# Patient Record
Sex: Male | Born: 1945 | Hispanic: No | Marital: Married | Smoking: Never smoker
Health system: Southern US, Community
[De-identification: ages and names within clinical notes are randomized; demographics above are authoritative.]

## PROBLEM LIST (undated history)

## (undated) DIAGNOSIS — C61 Malignant neoplasm of prostate: Secondary | ICD-10-CM

## (undated) DIAGNOSIS — K429 Umbilical hernia without obstruction or gangrene: Secondary | ICD-10-CM

## (undated) HISTORY — DX: Malignant neoplasm of prostate: C61

---

## 2012-03-02 HISTORY — PX: PROSTATE BIOPSY: SHX241

## 2012-09-26 ENCOUNTER — Other Ambulatory Visit (HOSPITAL_COMMUNITY): Payer: Self-pay | Admitting: Urology

## 2012-09-26 DIAGNOSIS — C61 Malignant neoplasm of prostate: Secondary | ICD-10-CM

## 2012-10-11 ENCOUNTER — Ambulatory Visit: Payer: Self-pay

## 2012-10-11 ENCOUNTER — Ambulatory Visit: Payer: Self-pay | Admitting: Radiation Oncology

## 2012-10-12 ENCOUNTER — Ambulatory Visit (HOSPITAL_COMMUNITY)
Admission: RE | Admit: 2012-10-12 | Discharge: 2012-10-12 | Disposition: A | Discharge status: Home / Self Care | Payer: BC Managed Care – PPO | Source: Ambulatory Visit | Attending: Urology | Admitting: Urology

## 2012-10-12 ENCOUNTER — Ambulatory Visit (HOSPITAL_COMMUNITY): Admission: RE | Admit: 2012-10-12 | Payer: Self-pay | Source: Ambulatory Visit

## 2012-10-12 ENCOUNTER — Other Ambulatory Visit (HOSPITAL_COMMUNITY): Payer: Self-pay

## 2012-10-12 DIAGNOSIS — N402 Nodular prostate without lower urinary tract symptoms: Secondary | ICD-10-CM | POA: Insufficient documentation

## 2012-10-12 DIAGNOSIS — R972 Elevated prostate specific antigen [PSA]: Secondary | ICD-10-CM | POA: Insufficient documentation

## 2012-10-12 DIAGNOSIS — C61 Malignant neoplasm of prostate: Secondary | ICD-10-CM | POA: Insufficient documentation

## 2012-10-12 MED ORDER — GADOBENATE DIMEGLUMINE 529 MG/ML IV SOLN
20.0000 mL | Freq: Once | INTRAVENOUS | Status: AC | PRN
  Administered 2012-10-12: 20 mL via INTRAVENOUS

## 2012-10-19 ENCOUNTER — Encounter: Payer: Self-pay | Admitting: Oncology

## 2012-10-19 DIAGNOSIS — C61 Malignant neoplasm of prostate: Secondary | ICD-10-CM

## 2012-10-19 NOTE — Progress Notes (Signed)
GU Location of Tumor / Histology: prostate cancer  If Prostate Cancer, Gleason Score is (3 + 3=6, four cores, right and left) and PSA is (9.49), prostate volume 7.82  Patient presented with elevated PSA.  Biopsies of prostate (if applicable) revealed: adenocarcinoma of the prostate  Past/Anticipated interventions by urology, if any: prostate biopsy, possible brachytherapy  Past/Anticipated interventions by medical oncology, if any:  none  Weight changes, if any:  none  Bowel/Bladder complaints, if any:  He usually gets up once per night to urinate.     Nausea/Vomiting, if any:  no  Pain issues, if any:  none   SAFETY ISSUES:  Prior radiation?  no  Pacemaker/ICD?  no  Possible current pregnancy?  no  Is the patient on methotrexate?  no  Current Complaints / other details:   Mr. Bolander is here for a consult for prostate cancer.  He does have a question about his MRI results from 4/18 because another biopsy was requested by Dr. Mena Goes.

## 2012-10-23 ENCOUNTER — Ambulatory Visit
Admission: RE | Admit: 2012-10-23 | Discharge: 2012-10-23 | Disposition: A | Discharge status: Home / Self Care | Payer: BC Managed Care – PPO | Source: Ambulatory Visit | Attending: Radiation Oncology | Admitting: Radiation Oncology

## 2012-10-23 ENCOUNTER — Encounter: Payer: Self-pay | Admitting: Radiation Oncology

## 2012-10-23 VITALS — BP 149/75 | HR 103 | Temp 98.8°F | Ht 67.0 in | Wt 228.8 lb

## 2012-10-23 DIAGNOSIS — C61 Malignant neoplasm of prostate: Secondary | ICD-10-CM | POA: Insufficient documentation

## 2012-10-23 NOTE — Progress Notes (Signed)
   Complex simulation/treatment planning note: The patient was taken to the CT simulator. He was placed supine. His pelvis was scanned. The CT data set was sent to the treatment planning system. I contoured his prostate and projected the prostate over his bony arch, simulating his seed implant. The arch is open. The prostate volume is 35.7 cc with a prostatic length of 4.2 cm. He is a candidate for seed implantation. I prescribing 14,500 cGy utilizing I-125 seeds. He is to be implanted with the [employer] system.

## 2012-10-23 NOTE — Progress Notes (Signed)
Please see the Nurse Progress Note in the MD Initial Consult Encounter for this patient. 

## 2012-10-23 NOTE — Progress Notes (Signed)
[facility] Radiation Oncology NEW PATIENT EVALUATION  Name: Albert Stone MRN: [id number]  Date:   10/23/2012           DOB: 1946-05-04  Status: outpatient   CC: Dr. Duane Lope,   Marlowe Sax*    REFERRING PHYSICIAN: Marlowe Sax*   DIAGNOSIS: Stage TI C. favorable risk adenocarcinoma prostate   HISTORY OF PRESENT ILLNESS:  Albert Stone is a 67 y.o. male who is seen today for the courtesy of Dr. Jerilee Field for consideration of radiation therapy in the management of his stage TI C. favorable risk adenocarcinoma prostate. He was noted to have a rise in his PSA of 4.82 in July 2010 to 7.82 by June of 2013. On 03/02/2012 he underwent ultrasound-guided biopsies which revealed Gleason 6 (3+3) involving less than 5% of one core from the right base, 10% of one core from the right mid gland, 20% of one core from left lateral base and less than 5% of one core from left lateral apex. His gland volume was approximately 36 cc. I understand that a followup PSA last month was elevated at 9.49, and now the patient is interested in pursuing possible seed implantation. He is doing well from a GU and GI standpoint. His I PSS score is 3. He is potent.  PREVIOUS RADIATION THERAPY: No   PAST MEDICAL HISTORY:  has a past medical history of Prostate cancer and Elevated PSA (08/2012).     PAST SURGICAL HISTORY:  Past Surgical History  Procedure Laterality Date  . Prostate biopsy  03/02/2012     FAMILY HISTORY: His father died at age 61, unknown cause. His mother died at age 39, also unknown cause. No new family history of prostate cancer.   SOCIAL HISTORY:  reports that he has never smoked. He does not have any smokeless tobacco history on file. He reports that  drinks alcohol. Married, 3 children. He teaches Chiropractor at [employer] He is a native of [place].  ALLERGIES: Review of patient's allergies indicates no known  allergies.   MEDICATIONS:  Current Outpatient Prescriptions  Medication Sig Dispense Refill  . Multiple Vitamin (MULTIVITAMIN) tablet Take 1 tablet by mouth daily.       No current facility-administered medications for this encounter.     REVIEW OF SYSTEMS:  Pertinent items are noted in HPI.    PHYSICAL EXAM:  height is 5\' 7"  (1.702 m) and weight is 228 lb 12.8 oz (103.783 kg). His temperature is 98.8 F (37.1 C). His blood pressure is 149/75 and his pulse is 103.   Alert and oriented 67 year old black male appearing younger than his stated age. Head and neck examination: Grossly unremarkable. Nodes: Without palpable cervical or supraclavicular lymphadenopathy. Chest: Lungs clear. Back: Without spinal or CVA tenderness. Heart: Regular rate rhythm. Abdomen: Without masses organomegaly. Genitalia: Unremarkable to inspection. Rectal: The prostate gland is normal size and is without focal induration or nodularity. Extremities: Without edema. Neurologic examination: Grossly nonfocal.   LABORATORY DATA:  No results found for this basename: WBC, HGB, HCT, MCV, PLT   No results found for this basename: NA, K, CL, CO2   No results found for this basename: ALT, AST, GGT, ALKPHOS, BILITOT   PSA 9.49 from March of 2014 (verbal)   IMPRESSION: Stage TI C. favorable risk adenocarcinoma prostate. I explained to the patient that his prognosis is related to his stage, PSA level, and Gleason score. All are favorable. Management options include surgery versus continued close  surveillance versus radiation therapy. Radiation therapy options include seed implantation alone or 8 weeks of external beam/IMRT. After a lengthy discussion, he is most interested in seed implantation. We discussed the potential acute and late toxicities of radiation therapy. We also discussed radiation therapy safety precautions. We discussed the need for a CT arch study, and since he has made his final decision, he would like to have  this today. Consent is signed today.   PLAN: As discussed above. He'll undergo a CT arch study today and then we'll get him scheduled for seed implantation with Dr. Mena Goes.   I spent 60 minutes minutes face to face with the patient and more than 50% of that time was spent in counseling and/or coordination of care.

## 2012-10-24 ENCOUNTER — Other Ambulatory Visit: Payer: Self-pay | Admitting: Urology

## 2012-10-24 ENCOUNTER — Telehealth: Payer: Self-pay | Admitting: *Deleted

## 2012-10-24 NOTE — Telephone Encounter (Signed)
CALLED PATIENT TO INFORM OF IMPLANT DATE, LVM FOR A RETURN CALL 

## 2012-11-08 ENCOUNTER — Telehealth: Payer: Self-pay | Admitting: *Deleted

## 2012-11-08 NOTE — Telephone Encounter (Signed)
CALLED PATIENT TO ASK A QUESTION, LVM FOR A RETURN CALL 

## 2012-11-10 ENCOUNTER — Encounter: Payer: Self-pay | Admitting: Radiation Oncology

## 2012-11-10 NOTE — Progress Notes (Unsigned)
I received a note from Dr. Mena Goes indicating that his MRI scan shows several small foci of low signal intensity within the peripheral zone along with a 1 cm nodule of low signal intensity within the central gland. He plans to perform repeat ultrasound-guided biopsies. He is tentatively scheduled for seed implantation on June 24, and I suspect that we'll need to postpone his implant

## 2012-11-12 ENCOUNTER — Encounter: Payer: Self-pay | Admitting: Radiation Oncology

## 2012-11-12 NOTE — Progress Notes (Signed)
I spoke with the patient this morning. He tells me that his repeat prostate biopsies we on May 28, and therefore we should have his pathology report back by early the week of June 2. If he remains a candidate for seed implantation then we can get his seeds by the time of his previously scheduled implant on June 24. Therefore, will get him back on the implant schedule with Dr. Mena Goes for June 24.

## 2012-11-16 ENCOUNTER — Ambulatory Visit (HOSPITAL_BASED_OUTPATIENT_CLINIC_OR_DEPARTMENT_OTHER): Payer: BC Managed Care – PPO

## 2012-11-16 ENCOUNTER — Ambulatory Visit (HOSPITAL_BASED_OUTPATIENT_CLINIC_OR_DEPARTMENT_OTHER)
Admission: RE | Admit: 2012-11-16 | Discharge: 2012-11-16 | Disposition: A | Discharge status: Home / Self Care | Payer: BC Managed Care – PPO | Source: Ambulatory Visit | Attending: Urology | Admitting: Urology

## 2012-11-16 ENCOUNTER — Other Ambulatory Visit: Payer: Self-pay

## 2012-11-16 DIAGNOSIS — C61 Malignant neoplasm of prostate: Secondary | ICD-10-CM | POA: Insufficient documentation

## 2012-11-16 DIAGNOSIS — I77810 Thoracic aortic ectasia: Secondary | ICD-10-CM | POA: Insufficient documentation

## 2012-11-16 DIAGNOSIS — Z01818 Encounter for other preprocedural examination: Secondary | ICD-10-CM | POA: Insufficient documentation

## 2012-12-10 ENCOUNTER — Telehealth: Payer: Self-pay | Admitting: *Deleted

## 2012-12-10 NOTE — Telephone Encounter (Signed)
Called patient to inform that it would be o.k. To get labs done tomorrow at [facility], lvm on his voice mail

## 2012-12-11 ENCOUNTER — Encounter (HOSPITAL_BASED_OUTPATIENT_CLINIC_OR_DEPARTMENT_OTHER): Payer: Self-pay | Admitting: *Deleted

## 2012-12-11 LAB — APTT: aPTT: 26 seconds (ref 24–37)

## 2012-12-11 LAB — PROTIME-INR: Prothrombin Time: 13.1 seconds (ref 11.6–15.2)

## 2012-12-11 LAB — COMPREHENSIVE METABOLIC PANEL
Alkaline Phosphatase: 52 U/L (ref 39–117)
BUN: 14 mg/dL (ref 6–23)
CO2: 24 mEq/L (ref 19–32)
Chloride: 107 mEq/L (ref 96–112)
GFR calc Af Amer: 82 mL/min — ABNORMAL LOW (ref >90)
GFR calc non Af Amer: 71 mL/min — ABNORMAL LOW (ref >90)
Glucose, Bld: 159 mg/dL — ABNORMAL HIGH (ref 70–99)
Potassium: 4.1 mEq/L (ref 3.5–5.1)
Total Bilirubin: 0.3 mg/dL (ref 0.3–1.2)

## 2012-12-11 LAB — CBC
HCT: 45.1 % (ref 39.0–52.0)
Hemoglobin: 15.3 g/dL (ref 13.0–17.0)
MCV: 89.7 fL (ref 78.0–100.0)
WBC: 5 10*3/uL (ref 4.0–10.5)

## 2012-12-11 NOTE — Progress Notes (Signed)
NPO AFTER MN. ARRIVES AT 0600. CURRENT LABS DONE TODAY. CURRENT EKG AND CXR IN EPIC AND CXR. WILL DO FLEET ENEMA.

## 2012-12-17 ENCOUNTER — Telehealth: Payer: Self-pay | Admitting: *Deleted

## 2012-12-17 NOTE — Anesthesia Preprocedure Evaluation (Addendum)
Anesthesia Evaluation  Patient identified by MRN, date of birth, ID band Patient awake    Reviewed: Allergy & Precautions, H&P , NPO status , Patient's Chart, lab work & pertinent test results  Airway Mallampati: II TM Distance: >3 FB Neck ROM: Full    Dental  (+) Dental Advisory Given and Teeth Intact   Pulmonary neg pulmonary ROS,    Pulmonary exam normal       Cardiovascular negative cardio ROS  Rhythm:Regular Rate:Normal     Neuro/Psych negative neurological ROS  negative psych ROS   GI/Hepatic negative GI ROS, Neg liver ROS,   Endo/Other  negative endocrine ROS  Renal/GU negative Renal ROS  negative genitourinary   Musculoskeletal negative musculoskeletal ROS (+)   Abdominal   Peds  Hematology negative hematology ROS (+)   Anesthesia Other Findings   Reproductive/Obstetrics                          Anesthesia Physical Anesthesia Plan  ASA: II  Anesthesia Plan: General   Post-op Pain Management:    Induction: Intravenous  Airway Management Planned: LMA  Additional Equipment:   Intra-op Plan:   Post-operative Plan: Extubation in OR  Informed Consent: I have reviewed the patients History and Physical, chart, labs and discussed the procedure including the risks, benefits and alternatives for the proposed anesthesia with the patient or authorized representative who has indicated his/her understanding and acceptance.   Dental advisory given  Plan Discussed with: CRNA  Anesthesia Plan Comments:         Anesthesia Quick Evaluation

## 2012-12-17 NOTE — Telephone Encounter (Signed)
Called patient to remind of procedure for 12-18-12, lvm for a return call

## 2012-12-18 ENCOUNTER — Ambulatory Visit (HOSPITAL_BASED_OUTPATIENT_CLINIC_OR_DEPARTMENT_OTHER): Payer: BC Managed Care – PPO | Admitting: Anesthesiology

## 2012-12-18 ENCOUNTER — Encounter (HOSPITAL_BASED_OUTPATIENT_CLINIC_OR_DEPARTMENT_OTHER)
Admission: RE | Disposition: A | Discharge status: Home / Self Care | Payer: Self-pay | Source: Ambulatory Visit | Attending: Urology

## 2012-12-18 ENCOUNTER — Ambulatory Visit (HOSPITAL_COMMUNITY): Payer: BC Managed Care – PPO

## 2012-12-18 ENCOUNTER — Encounter: Payer: Self-pay | Admitting: Radiation Oncology

## 2012-12-18 ENCOUNTER — Encounter (HOSPITAL_BASED_OUTPATIENT_CLINIC_OR_DEPARTMENT_OTHER): Payer: Self-pay | Admitting: Anesthesiology

## 2012-12-18 ENCOUNTER — Ambulatory Visit (HOSPITAL_BASED_OUTPATIENT_CLINIC_OR_DEPARTMENT_OTHER)
Admission: RE | Admit: 2012-12-18 | Discharge: 2012-12-18 | Disposition: A | Discharge status: Home / Self Care | Payer: BC Managed Care – PPO | Source: Ambulatory Visit | Attending: Urology | Admitting: Urology

## 2012-12-18 ENCOUNTER — Encounter (HOSPITAL_BASED_OUTPATIENT_CLINIC_OR_DEPARTMENT_OTHER): Payer: Self-pay | Admitting: *Deleted

## 2012-12-18 DIAGNOSIS — C61 Malignant neoplasm of prostate: Secondary | ICD-10-CM | POA: Insufficient documentation

## 2012-12-18 HISTORY — PX: RADIOACTIVE SEED IMPLANT: SHX5150

## 2012-12-18 HISTORY — PX: CYSTOSCOPY: SHX5120

## 2012-12-18 SURGERY — INSERTION, RADIATION SOURCE, PROSTATE
Anesthesia: General | Site: Prostate | Wound class: Clean Contaminated

## 2012-12-18 MED ORDER — TAMSULOSIN HCL 0.4 MG PO CAPS: 0.4000 mg | ORAL_CAPSULE | Freq: Every day | ORAL | Status: DC

## 2012-12-18 MED ORDER — DEXAMETHASONE SODIUM PHOSPHATE 4 MG/ML IJ SOLN
INTRAMUSCULAR | Status: DC | PRN
  Administered 2012-12-18: 8 mg via INTRAVENOUS

## 2012-12-18 MED ORDER — OXYCODONE-ACETAMINOPHEN 5-325 MG PO TABS: 1.0000 | ORAL_TABLET | ORAL | Status: DC | PRN

## 2012-12-18 MED ORDER — CIPROFLOXACIN IN D5W 400 MG/200ML IV SOLN
400.0000 mg | INTRAVENOUS | Status: AC
  Administered 2012-12-18: 400 mg via INTRAVENOUS
  Filled 2012-12-18: qty 200

## 2012-12-18 MED ORDER — ACETAMINOPHEN 10 MG/ML IV SOLN
INTRAVENOUS | Status: DC | PRN
  Administered 2012-12-18: 1000 mg via INTRAVENOUS

## 2012-12-18 MED ORDER — LACTATED RINGERS IV SOLN
INTRAVENOUS | Status: DC
  Filled 2012-12-18: qty 1000

## 2012-12-18 MED ORDER — IOHEXOL 350 MG/ML SOLN
INTRAVENOUS | Status: DC | PRN
  Administered 2012-12-18: 7 mL

## 2012-12-18 MED ORDER — FLEET ENEMA 7-19 GM/118ML RE ENEM
1.0000 | ENEMA | Freq: Once | RECTAL | Status: DC
Start: 2012-12-19 — End: 2012-12-18
  Filled 2012-12-18: qty 1

## 2012-12-18 MED ORDER — EPHEDRINE SULFATE 50 MG/ML IJ SOLN
INTRAMUSCULAR | Status: DC | PRN
  Administered 2012-12-18 (×2): 10 mg via INTRAVENOUS

## 2012-12-18 MED ORDER — MIDAZOLAM HCL 5 MG/5ML IJ SOLN
INTRAMUSCULAR | Status: DC | PRN
  Administered 2012-12-18: 2 mg via INTRAVENOUS

## 2012-12-18 MED ORDER — HYDROMORPHONE HCL PF 1 MG/ML IJ SOLN
0.2500 mg | INTRAMUSCULAR | Status: DC | PRN
  Filled 2012-12-18: qty 1

## 2012-12-18 MED ORDER — ONDANSETRON HCL 4 MG/2ML IJ SOLN
INTRAMUSCULAR | Status: DC | PRN
  Administered 2012-12-18: 4 mg via INTRAVENOUS

## 2012-12-18 MED ORDER — LIDOCAINE HCL (CARDIAC) 20 MG/ML IV SOLN
INTRAVENOUS | Status: DC | PRN
  Administered 2012-12-18: 100 mg via INTRAVENOUS

## 2012-12-18 MED ORDER — PROMETHAZINE HCL 25 MG/ML IJ SOLN
6.2500 mg | INTRAMUSCULAR | Status: DC | PRN
  Filled 2012-12-18: qty 1

## 2012-12-18 MED ORDER — LACTATED RINGERS IV SOLN
INTRAVENOUS | Status: DC
  Administered 2012-12-18 (×3): via INTRAVENOUS
  Filled 2012-12-18: qty 1000

## 2012-12-18 MED ORDER — FENTANYL CITRATE 0.05 MG/ML IJ SOLN
INTRAMUSCULAR | Status: DC | PRN
  Administered 2012-12-18 (×3): 25 ug via INTRAVENOUS
  Administered 2012-12-18 (×2): 50 ug via INTRAVENOUS
  Administered 2012-12-18: 25 ug via INTRAVENOUS
  Administered 2012-12-18: 50 ug via INTRAVENOUS
  Administered 2012-12-18 (×2): 25 ug via INTRAVENOUS

## 2012-12-18 MED ORDER — PROPOFOL 10 MG/ML IV BOLUS
INTRAVENOUS | Status: DC | PRN
  Administered 2012-12-18: 250 mg via INTRAVENOUS

## 2012-12-18 MED ORDER — STERILE WATER FOR IRRIGATION IR SOLN
Status: DC | PRN
  Administered 2012-12-18: 3000 mL

## 2012-12-18 MED ORDER — CEPHALEXIN 500 MG PO CAPS: 500.0000 mg | ORAL_CAPSULE | Freq: Three times a day (TID) | ORAL | Status: DC

## 2012-12-18 SURGICAL SUPPLY — 22 items
BAG URINE DRAINAGE (UROLOGICAL SUPPLIES) ×3 IMPLANT
BLADE SURG ROTATE 9660 (MISCELLANEOUS) ×3 IMPLANT
CATH FOLEY 2WAY SLVR  5CC 16FR (CATHETERS) ×2
CATH FOLEY 2WAY SLVR 5CC 16FR (CATHETERS) ×4 IMPLANT
CATH ROBINSON RED A/P 20FR (CATHETERS) ×3 IMPLANT
CLOTH BEACON ORANGE TIMEOUT ST (SAFETY) ×3 IMPLANT
COVER MAYO STAND STRL (DRAPES) ×3 IMPLANT
COVER TABLE BACK 60X90 (DRAPES) ×3 IMPLANT
DRSG TEGADERM 4X4.75 (GAUZE/BANDAGES/DRESSINGS) ×3 IMPLANT
DRSG TEGADERM 8X12 (GAUZE/BANDAGES/DRESSINGS) ×3 IMPLANT
GLOVE BIO SURGEON STRL SZ7.5 (GLOVE) ×18 IMPLANT
GLOVE ECLIPSE 7.0 STRL STRAW (GLOVE) ×6 IMPLANT
GLOVE ECLIPSE 8.0 STRL XLNG CF (GLOVE) IMPLANT
GOWN PREVENTION PLUS LG XLONG (DISPOSABLE) ×3 IMPLANT
GOWN STRL REIN XL XLG (GOWN DISPOSABLE) ×3 IMPLANT
HOLDER FOLEY CATH W/STRAP (MISCELLANEOUS) ×3 IMPLANT
NUCLETRON  SELECTSEED ×3 IMPLANT
PACK CYSTOSCOPY (CUSTOM PROCEDURE TRAY) ×3 IMPLANT
SYRINGE 10CC LL (SYRINGE) ×3 IMPLANT
UNDERPAD 30X30 INCONTINENT (UNDERPADS AND DIAPERS) ×6 IMPLANT
WATER STERILE IRR 3000ML UROMA (IV SOLUTION) ×3 IMPLANT
WATER STERILE IRR 500ML POUR (IV SOLUTION) ×3 IMPLANT

## 2012-12-18 NOTE — H&P (Signed)
  H&P   History of Present Illness: Pt presents with T1c, Gleason 3+3=6 in multiple cores, PSA 9.5 Prostate cancer for brachytherapy. He is well with no complaints. No dysuria or hematuria. Prostate 33 g.   Past Medical History  Diagnosis Date  . Prostate cancer    Past Surgical History  Procedure Laterality Date  . Prostate biopsy  03/02/2012    Home Medications:  Prescriptions prior to admission  Medication Sig Dispense Refill  . Multiple Vitamin (MULTIVITAMIN) tablet Take 1 tablet by mouth daily.       Allergies: No Known Allergies  History reviewed. No pertinent family history. Social History:  reports that he has never smoked. He has never used smokeless tobacco. He reports that  drinks alcohol. He reports that he does not use illicit drugs.  ROS: A complete review of systems was performed.  All systems are negative except for pertinent findings as noted. @ROS @   Physical Exam:  Vital signs in last 24 hours:   General:  Alert and oriented, No acute distress HEENT: Normocephalic, atraumatic Neck: No JVD or lymphadenopathy Cardiovascular: Regular rate and rhythm Lungs: Regular rate and effort Abdomen: Soft, nontender, nondistended, no abdominal masses Back: No CVA tenderness Extremities: No edema Neurologic: Grossly intact  Laboratory Data:  No results found for this or any previous visit (from the past 24 hour(s)). No results found for this or any previous visit (from the past 240 hour(s)). Creatinine:  Recent Labs  12/11/12 1013  CREATININE 1.08    Impression/Assessment:  Prostate cancer  Plan:  I discussed again with patient the nature, potential benefits, risks and alternatives to brachytherapy, including side effects of the proposed treatment, the likelihood of the patient achieving the goals of the procedure, and any potential problems that might occur during the procedure or recuperation. All questions answered. Patient elects to proceed. Will d/c with  foley and remove tomorrow in office. Tamsulosin, abx and pain med Rx for home.    Antony Haste 12/18/2012, 7:29 AM

## 2012-12-18 NOTE — Op Note (Signed)
Preoperative diagnosis: Prostate cancer Postoperative diagnosis: Prostate cancer  Procedure: I-125 radioactive seed implant of the prostate Cystoscopy  Surgeon: Mena Goes Asst.: Dayton Scrape  Description of procedure: The patient is brought to the operating room. After adequate anesthesia he was placed in lithotomy position. Foley catheter was placed. The scrotum was secured. The lower abdomen genitalia and perineum were prepped and draped in the usual fashion.He underwent treatment planning after insertion of a rectal tube. He had 26 active needles placed under real time ultrasound guidance with a total of 72 seeds. Total Implant activity was 31.5 mCi. Fluoroscopic documentation was obtained. Cystoscopy was performed which revealed one clear spacer in the bladder. This was removed without difficulty. There were no seeds. The bladder was carefully inspected and no trauma was noted to the bladder neck or trigone. A 16 French Foley catheter was placed and left to gravity drainage.  Complications: None Blood loss: Minimal Specimens: None Drains: 16 French Foley  Disposition: Patient stable to PACU

## 2012-12-18 NOTE — Anesthesia Procedure Notes (Signed)
Procedure Name: LMA Insertion Date/Time: 12/18/2012 7:38 AM Performed by: Norva Pavlov Pre-anesthesia Checklist: Patient identified, Emergency Drugs available, Suction available and Patient being monitored Patient Re-evaluated:Patient Re-evaluated prior to inductionOxygen Delivery Method: Circle System Utilized Preoxygenation: Pre-oxygenation with 100% oxygen Intubation Type: IV induction Ventilation: Mask ventilation without difficulty LMA: LMA inserted LMA Size: 5.0 Number of attempts: 1 Airway Equipment and Method: bite block Placement Confirmation: positive ETCO2 Tube secured with: Tape Dental Injury: Teeth and Oropharynx as per pre-operative assessment

## 2012-12-18 NOTE — Progress Notes (Addendum)
CC: Dr. Jerilee Field, Dr. Duane Lope  End of treatment summary:  Diagnosis: Stage TI C. favorable risk adenocarcinoma prostate  Intent: Curative  Implant date: 12/22/2012  Site/dose: A prostate gland 14,500 cGy, isotope I-125 utilizing 72 seeds in 26 active needles. Individual seed activity 0.438 mCi per seed for a total implant activity of 31.5 mCi.  Narrative: The patient appears to have undergone a successful Nucletron seed Selectron implant with Dr. Mena Goes.  Plan: He is to see both of [place] for followup visit in approximately 3 weeks. At that time we'll obtain a CT scan for his post implant dosimetry.

## 2012-12-18 NOTE — Transfer of Care (Signed)
Immediate Anesthesia Transfer of Care Note  Patient: Albert Stone  Procedure(s) Performed: Procedure(s) (LRB): RADIOACTIVE SEED IMPLANT (N/A) CYSTOSCOPY FLEXIBLE (N/A)  Patient Location: PACU  Anesthesia Type: General  Level of Consciousness: asleep  Airway & Oxygen Therapy: Patient Spontanous Breathing and Patient connected to face mask oxygen, oral airway remaining  Post-op Assessment: Report given to PACU RN and Post -op Vital signs reviewed and stable  Post vital signs: Reviewed and stable  Complications: No apparent anesthesia complications

## 2012-12-18 NOTE — Anesthesia Postprocedure Evaluation (Signed)
Anesthesia Post Note  Patient: Albert Stone  Procedure(s) Performed: Procedure(s) (LRB): RADIOACTIVE SEED IMPLANT (N/A) CYSTOSCOPY FLEXIBLE (N/A)  Anesthesia type: General  Patient location: PACU  Post pain: Pain level controlled  Post assessment: Post-op Vital signs reviewed  Last Vitals:  Filed Vitals:   12/18/12 1305  BP: 130/75  Pulse:   Temp: 36.3 C  Resp: 16    Post vital signs: Reviewed  Level of consciousness: sedated  Complications: No apparent anesthesia complications

## 2012-12-18 NOTE — Progress Notes (Signed)
[facility] Radiation Oncology Brachytherapy Operative Procedure Note  Name: Albert Stone MRN: [id number]  Date:   10/24/2012           DOB: 1946-03-19  Status:outpatient    CC:No primary provider on file.  No ref. provider found  DIAGNOSIS: A 67 year old gentlemen with stage T1 C. adenocarcinoma of the prostate with a Gleason of 6 and a PSA of 9.49.  PROCEDURE: Insertion of radioactive I-125 seeds into the prostate gland.  RADIATION DOSE: 145 Gy, definitive therapy.  TECHNIQUE: Albert Stone was brought to the operating room with Dr. Mena Goes. He was placed in the dorsolithotomy position. He was catheterized and a rectal tube was inserted. The perineum was shaved, prepped and draped. The ultrasound probe was then introduced into the rectum to see the prostate gland.  TREATMENT DEVICE: A needle grid was attached to the ultrasound probe stand and anchor needles were placed.  COMPLEX ISODOSE CALCULATION: The prostate was imaged in 3D using a sagittal sweep of the prostate probe. These images were transferred to the planning computer. There, the prostate, urethra and rectum were defined on each axial reconstructed image. Then, the software created an optimized plan and a few seed positions were adjusted. Then the accepted plan was uploaded to the seed Selectron afterloading unit.  SPECIAL TREATMENT PROCEDURE/SUPERVISION AND HANDLING: The Nucletron FIRST system was used to place the needles under sagittal guidance. A total of 20 needles were used to deposit 72 seeds in the prostate gland. The individual seed activity was 0.438 mCi for a total implant activity of 31.5 mCi.  COMPLEX SIMULATION: At the end of the procedure, an anterior radiograph of the pelvis was obtained to document seed positioning and count. Cystoscopy was performed to check the urethra and bladder.  MICRODOSIMETRY: At the end of the procedure, the patient was emitting 0.04 mrem/hr at 1 meter. Accordingly,  he was considered safe for hospital discharge.  PLAN: The patient will return to the radiation oncology clinic for post implant CT dosimetry in three weeks.

## 2012-12-19 ENCOUNTER — Encounter (HOSPITAL_BASED_OUTPATIENT_CLINIC_OR_DEPARTMENT_OTHER): Payer: Self-pay | Admitting: Urology

## 2013-01-04 ENCOUNTER — Telehealth: Payer: Self-pay | Admitting: *Deleted

## 2013-01-04 NOTE — Telephone Encounter (Signed)
CALLED PATIENT TO REMIND OF APPTS. FOR 01-07-13, LVM FOR A RETURN CALL

## 2013-01-07 ENCOUNTER — Ambulatory Visit
Admit: 2013-01-07 | Discharge: 2013-01-07 | Disposition: A | Discharge status: Home / Self Care | Payer: BC Managed Care – PPO | Attending: Radiation Oncology | Admitting: Radiation Oncology

## 2013-01-07 ENCOUNTER — Ambulatory Visit
Admission: RE | Admit: 2013-01-07 | Discharge: 2013-01-07 | Disposition: A | Discharge status: Home / Self Care | Payer: BC Managed Care – PPO | Source: Ambulatory Visit | Attending: Radiation Oncology | Admitting: Radiation Oncology

## 2013-01-07 VITALS — BP 136/88 | HR 79 | Temp 98.4°F | Ht 67.0 in | Wt 229.7 lb

## 2013-01-07 DIAGNOSIS — C61 Malignant neoplasm of prostate: Secondary | ICD-10-CM

## 2013-01-07 NOTE — Progress Notes (Signed)
Albert Stone here for post seed follow up.  He denies pain.  He states he occasionally has a weaker stream than he had before the seed placement.  He also occasionally has burning on urination.  He denies having to get up a night to urinate and denies seeing any blood in his urine.  He denies fatigue and nausea.

## 2013-01-07 NOTE — Progress Notes (Signed)
Complex simulation note: The patient was taken to the CT simulator. His pelvis was scanned. The CT data set was sent to the Nucletron treatment planning system for contouring of his prostate and rectum. We'll then proceed with 3-D simulation to assess the quality of his implant.

## 2013-01-07 NOTE — Progress Notes (Signed)
CC: Dr. Jerilee Field  Followup note:  The patient returns today approximately 3 weeks following his prostate seed implant with Dr. Mena Goes. He is doing well from a GU and GI standpoint although he does have a slightly weaker urinary stream.  His CT scan today shows what appears to be an excellent seed distribution.  Physical examination: Alert and oriented. Wt Readings from Last 3 Encounters:  01/07/13 229 lb 11.2 oz (104.191 kg)  12/18/12 227 lb 5 oz (103.108 kg)  12/18/12 227 lb 5 oz (103.108 kg)   Temp Readings from Last 3 Encounters:  01/07/13 98.4 F (36.9 C)   12/18/12 97.3 F (36.3 C) Oral  12/18/12 97.3 F (36.3 C) Oral   BP Readings from Last 3 Encounters:  01/07/13 136/88  12/18/12 130/75  12/18/12 130/75   Pulse Readings from Last 3 Encounters:  01/07/13 79  12/18/12 92  12/18/12 92   Rectal examination not performed today.  Impression: Satisfactory progress with minimal radiation urethritis. I explained to the patient that this could worsen while he is actively being treated with brachytherapy.  Plan: He'll see Dr. Mena Goes for a followup visit and PSA determination in 2 months. We will move ahead with his post implant dosimetry and forward the results to Dr. Mena Goes. I expect him to have excellent post implant dosimetry.

## 2013-01-08 ENCOUNTER — Encounter: Payer: Self-pay | Admitting: Radiation Oncology

## 2013-01-08 NOTE — Progress Notes (Addendum)
CC: Dr. Jerilee Field, Dr. Duane Lope   Post implant CT dosimetry/3-D simulation note: The patient underwent post-implant CT dosimetry/3-D simulation to assess the quality of his prostate seed implant. Dose volume histograms were obtained for the prostate and rectum. His intraoperative prostate volume by ultrasound was 38.5 cc and his postoperative prostate volume by CT was 38.6 cc, an excellent correlation. His prostate D 90 is 121.5% and V100 98.2%. Only 0.1 cc of rectum received the prescribed dose of 14,500 cGy. In summary, the patient has excellent post implant dosimetry with a very low risk for late rectal toxicity.

## 2013-01-10 ENCOUNTER — Other Ambulatory Visit: Payer: Self-pay | Admitting: Radiation Oncology

## 2013-09-18 IMAGING — CR DG CHEST 2V
2 series · 2 of 2 positions shown · non-contrast
Comparison: None.

CLINICAL DATA: Preoperative evaluation for prostate cancer surgery.

CHEST - 2 VIEW

[w chest pa]
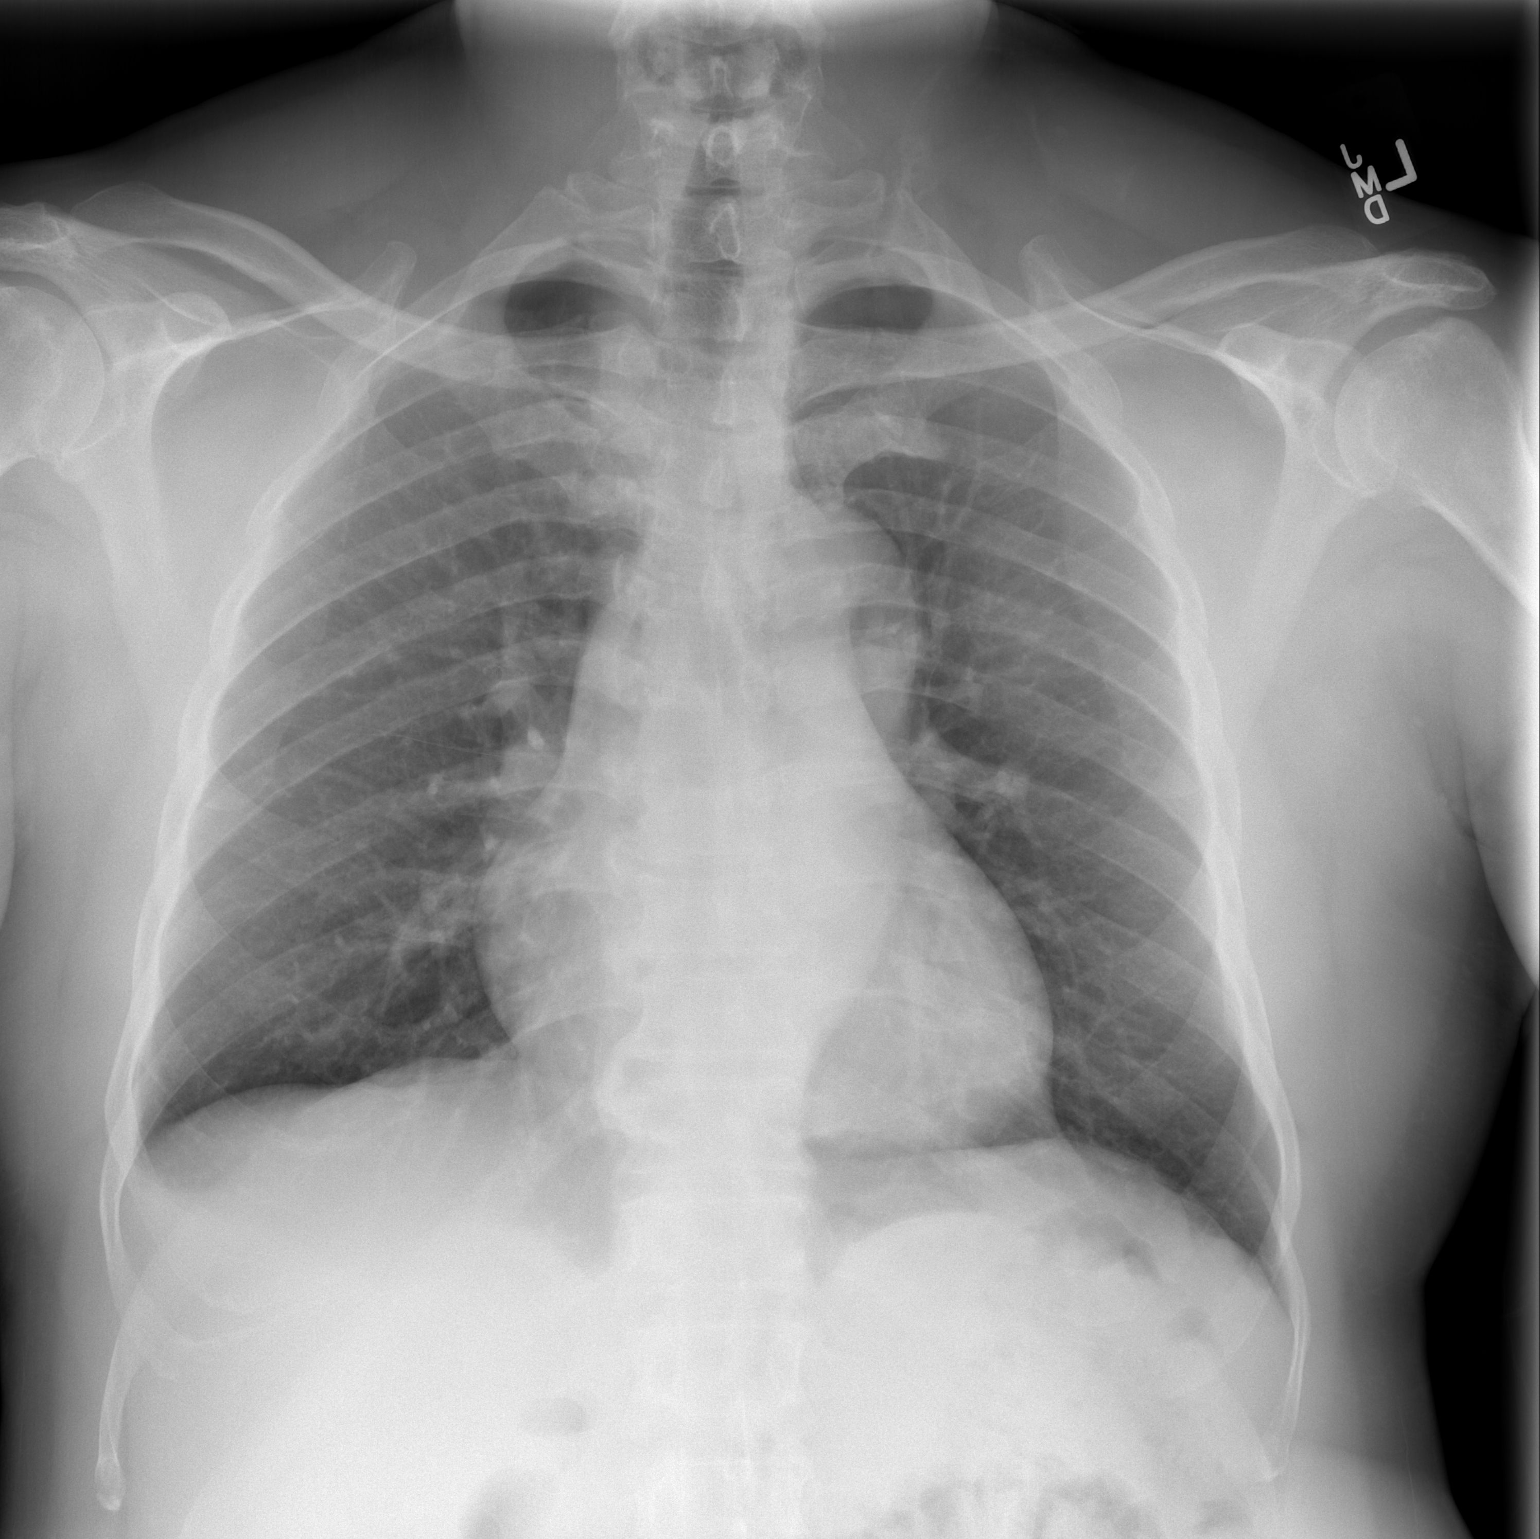

[w chest lat]
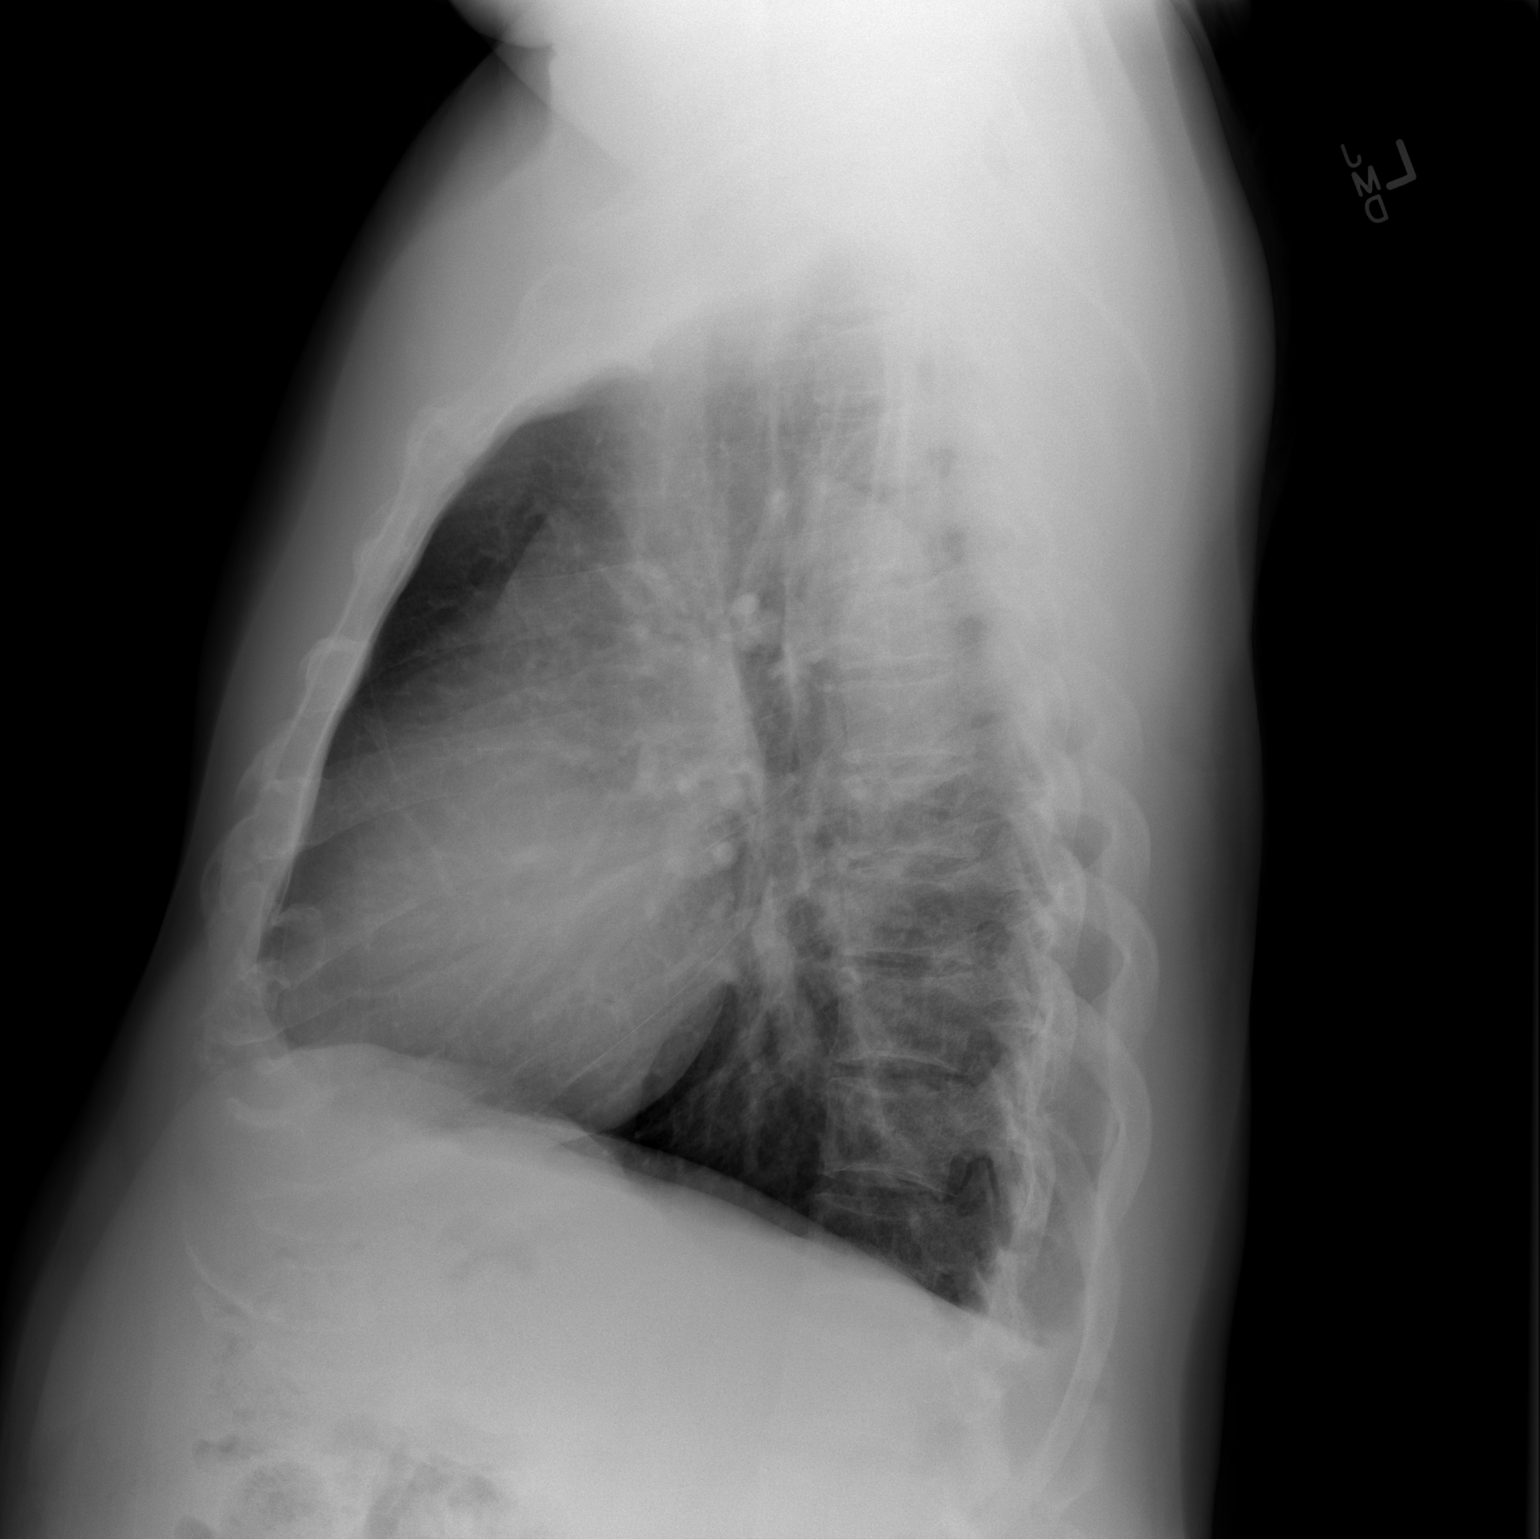

[2 of 2 positions shown; findings below may reference images not displayed]

FINDINGS: Heart size is within normal limits.  Ectasia of the
thoracic aorta is noted.  The lung fields are clear with no signs
of focal infiltrate or congestive failure.  No pleural fluid or
significant peribronchial cuffing is identified.

Bony structures appear intact.
IMPRESSION: Aortic ectasia.  Otherwise unremarkable cardiopulmonary appearance
with no worrisome focal abnormality seen.

## 2015-01-05 ENCOUNTER — Other Ambulatory Visit: Payer: Self-pay | Admitting: Gastroenterology

## 2015-01-06 NOTE — Addendum Note (Signed)
Addended by: Wilford Corner on: 01/06/2015 05:38 PM   Modules accepted: Orders

## 2015-01-07 ENCOUNTER — Ambulatory Visit (HOSPITAL_COMMUNITY)
Admission: RE | Admit: 2015-01-07 | Discharge: 2015-01-07 | Disposition: A | Discharge status: Home / Self Care | Payer: BLUE CROSS/BLUE SHIELD | Source: Ambulatory Visit | Attending: Gastroenterology | Admitting: Gastroenterology

## 2015-01-07 ENCOUNTER — Encounter (HOSPITAL_COMMUNITY): Payer: Self-pay

## 2015-01-07 ENCOUNTER — Encounter (HOSPITAL_COMMUNITY)
Admission: RE | Disposition: A | Discharge status: Home / Self Care | Payer: Self-pay | Source: Ambulatory Visit | Attending: Gastroenterology

## 2015-01-07 DIAGNOSIS — K625 Hemorrhage of anus and rectum: Secondary | ICD-10-CM | POA: Diagnosis present

## 2015-01-07 DIAGNOSIS — K648 Other hemorrhoids: Secondary | ICD-10-CM | POA: Diagnosis not present

## 2015-01-07 DIAGNOSIS — D123 Benign neoplasm of transverse colon: Secondary | ICD-10-CM | POA: Insufficient documentation

## 2015-01-07 HISTORY — PX: COLONOSCOPY: SHX5424

## 2015-01-07 SURGERY — COLONOSCOPY
Anesthesia: Moderate Sedation

## 2015-01-07 MED ORDER — FENTANYL CITRATE (PF) 100 MCG/2ML IJ SOLN
INTRAMUSCULAR | Status: AC
  Filled 2015-01-07: qty 2

## 2015-01-07 MED ORDER — MIDAZOLAM HCL 5 MG/5ML IJ SOLN
INTRAMUSCULAR | Status: DC | PRN
  Administered 2015-01-07 (×4): 2 mg via INTRAVENOUS

## 2015-01-07 MED ORDER — SODIUM CHLORIDE 0.9 % IV SOLN
INTRAVENOUS | Status: DC
  Administered 2015-01-07: 500 mL via INTRAVENOUS

## 2015-01-07 MED ORDER — MIDAZOLAM HCL 5 MG/ML IJ SOLN
INTRAMUSCULAR | Status: AC
  Filled 2015-01-07: qty 2

## 2015-01-07 MED ORDER — FENTANYL CITRATE (PF) 100 MCG/2ML IJ SOLN
INTRAMUSCULAR | Status: DC | PRN
  Administered 2015-01-07 (×4): 25 ug via INTRAVENOUS

## 2015-01-07 NOTE — Op Note (Signed)
[facility] [place], [id number]   COLONOSCOPY PROCEDURE REPORT     EXAM DATE: 2015/01/08  PATIENT NAME:      Albert Stone, Albert Stone           MR #:      [id number]  BIRTHDATE:       April 21, 1946      VISIT #:     [id number]  ATTENDING:     Wilford Corner, MD     STATUS:     outpatient REFERRING MD:      Melinda Crutch, M.D. ASA CLASS:        Class II  INDICATIONS:  The patient is a 69 yr old male here for a colonoscopy due to rectal bleeding. PROCEDURE PERFORMED:     Colonoscopy with snare polypectomy MEDICATIONS:     Fentanyl 100 mcg IV and Versed 10 mg IV ESTIMATED BLOOD LOSS:     None  CONSENT: The patient understands the risks and benefits of the procedure and understands that these risks include, but are not limited to: sedation, allergic reaction, infection, perforation and/or bleeding. Alternative means of evaluation and treatment include, among others: physical exam, x-rays, and/or surgical intervention. The patient elects to proceed with this endoscopic procedure.  DESCRIPTION OF PROCEDURE: During intra-op preparation period all mechanical & medical equipment was checked for proper function. Hand hygiene and appropriate measures for infection prevention was taken. After the risks, benefits and alternatives of the procedure were thoroughly explained, Informed consent was verified, confirmed and timeout was successfully executed by the treatment team. A digital exam revealed no abnormalities of the rectum.      The    endoscope was introduced through the anus and advanced to the cecum, which was identified by both the appendix and ileocecal valve. The prep was good.. The instrument was then slowly withdrawn as the colon was fully examined. Estimated blood loss is zero unless otherwise noted in this procedure report. A 1 cm pedunculate polyp removed with snare cautery from the transverse colon. Colon otherwise normal.       Retroflexed views revealed small internal hemorrhoids.  The scope was then completely withdrawn from the patient and the procedure terminated.     ADVERSE EVENTS:      There were no immediate complications.   IMPRESSIONS:     Colon polyp - s/p snare cautery Small internal hemorrhoids  RECOMMENDATIONS:     F/U on path; No aspirin products for 2 weeks   Wilford Corner, MD eSigned:  Wilford Corner, MD 2015/01/08 11:10 AM   cc:  CPT CODES: ICD CODES:  The ICD and CPT codes recommended by this software are interpretations from the data that the clinical staff has captured with the software.  The verification of the translation of this report to the ICD and CPT codes and modifiers is the sole responsibility of the health care institution and practicing physician where this report was generated.  [employer] will not be held responsible for the validity of the ICD and CPT codes included on this report.  AMA assumes no liability for data contained or not contained herein. CPT is a Designer, television/film set of the [employer].  PATIENT NAME:  Albert Stone MR#: [id number]

## 2015-01-07 NOTE — H&P (Signed)
  Date of Initial H&P: 12/24/14  History reviewed, patient examined, no change in status, stable for surgery.

## 2015-01-07 NOTE — Interval H&P Note (Signed)
History and Physical Interval Note:  01/07/2015 10:24 AM  Albert Stone  has presented today for surgery, with the diagnosis of rectal bleeding  The various methods of treatment have been discussed with the patient and family. After consideration of risks, benefits and other options for treatment, the patient has consented to  Procedure(s): COLONOSCOPY (N/A) HOT HEMOSTASIS (ARGON PLASMA COAGULATION/BICAP) (N/A) as a surgical intervention .  The patient's history has been reviewed, patient examined, no change in status, stable for surgery.  I have reviewed the patient's chart and labs.  Questions were answered to the patient's satisfaction.     [place] C.

## 2015-01-07 NOTE — Discharge Instructions (Addendum)
No aspirin or ibuprofen products for the next 2 weeks. Will call you when your pathology results are complete.Colonoscopy, Care After Refer to this sheet in the next few weeks. These instructions provide you with information on caring for yourself after your procedure. Your health care provider may also give you more specific instructions. Your treatment has been planned according to current medical practices, but problems sometimes occur. Call your health care provider if you have any problems or questions after your procedure. WHAT TO EXPECT AFTER THE PROCEDURE  After your procedure, it is typical to have the following:  A small amount of blood in your stool.  Moderate amounts of gas and mild abdominal cramping or bloating. HOME CARE INSTRUCTIONS  Do not drive, operate machinery, or sign important documents for 24 hours.  You may shower and resume your regular physical activities, but move at a slower pace for the first 24 hours.  Take frequent rest periods for the first 24 hours.  Walk around or put a warm pack on your abdomen to help reduce abdominal cramping and bloating.  Drink enough fluids to keep your urine clear or pale yellow.  You may resume your normal diet as instructed by your health care provider. Avoid heavy or fried foods that are hard to digest.  Avoid drinking alcohol for 24 hours or as instructed by your health care provider.  Only take over-the-counter or prescription medicines as directed by your health care provider.  If a tissue sample (biopsy) was taken during your procedure:  Do not take aspirin or blood thinners for 7 days, or as instructed by your health care provider.  Do not drink alcohol for 7 days, or as instructed by your health care provider.  Eat soft foods for the first 24 hours. SEEK MEDICAL CARE IF: You have persistent spotting of blood in your stool 2-3 days after the procedure. SEEK IMMEDIATE MEDICAL CARE IF:  You have more than a small  spotting of blood in your stool.  You pass large blood clots in your stool.  Your abdomen is swollen (distended).  You have nausea or vomiting.  You have a fever.  You have increasing abdominal pain that is not relieved with medicine. Document Released: 01/26/2004 Document Revised: 04/03/2013 Document Reviewed: 02/18/2013 [facility] Patient Information 2015 [place], [place]. This information is not intended to replace advice given to you by your health care provider. Make sure you discuss any questions you have with your health care provider.

## 2015-01-08 ENCOUNTER — Encounter (HOSPITAL_COMMUNITY): Payer: Self-pay | Admitting: Gastroenterology

## 2015-10-27 DIAGNOSIS — Z0000 Encounter for general adult medical examination without abnormal findings: Secondary | ICD-10-CM | POA: Diagnosis not present

## 2015-10-27 DIAGNOSIS — C61 Malignant neoplasm of prostate: Secondary | ICD-10-CM | POA: Diagnosis not present

## 2015-10-27 DIAGNOSIS — R3129 Other microscopic hematuria: Secondary | ICD-10-CM | POA: Diagnosis not present

## 2015-10-28 ENCOUNTER — Other Ambulatory Visit: Payer: Self-pay | Admitting: Surgery

## 2015-10-28 DIAGNOSIS — K429 Umbilical hernia without obstruction or gangrene: Secondary | ICD-10-CM | POA: Diagnosis not present

## 2015-11-02 DIAGNOSIS — Z7189 Other specified counseling: Secondary | ICD-10-CM | POA: Diagnosis not present

## 2015-11-02 DIAGNOSIS — Z792 Long term (current) use of antibiotics: Secondary | ICD-10-CM | POA: Diagnosis not present

## 2016-02-15 DIAGNOSIS — R509 Fever, unspecified: Secondary | ICD-10-CM | POA: Diagnosis not present

## 2016-02-19 DIAGNOSIS — D72819 Decreased white blood cell count, unspecified: Secondary | ICD-10-CM | POA: Diagnosis not present

## 2016-03-18 ENCOUNTER — Encounter (HOSPITAL_BASED_OUTPATIENT_CLINIC_OR_DEPARTMENT_OTHER): Payer: Self-pay | Admitting: *Deleted

## 2016-03-21 DIAGNOSIS — M79675 Pain in left toe(s): Secondary | ICD-10-CM | POA: Diagnosis not present

## 2016-03-21 DIAGNOSIS — R030 Elevated blood-pressure reading, without diagnosis of hypertension: Secondary | ICD-10-CM | POA: Diagnosis not present

## 2016-03-23 ENCOUNTER — Other Ambulatory Visit: Payer: Self-pay | Admitting: Surgery

## 2016-03-23 NOTE — H&P (Signed)
Albert Stone  Location: [facility] Patient #: [id number] DOB: 21-May-1946 Married / Language: English / Race: Black or African American Male   History of Present Illness   The patient is a 70 year old male who presents with an umbilical hernia. This is a pleasant gentleman who is referred by Dr. Lona Kettle for evaluation of an umbilical hernia. The patient reports having a hernia most of his life. It is now recently slowly getting larger and causing some mild discomfort at the umbilicus especially with vigorous activity. He has had no obstructive symptoms and it has never become incarcerated. He is otherwise very active and very healthy. He has no other complaints.   Other Problems  Prostate Cancer  Past Surgical Histoty TURP  Diagnostic Studies History Marjean Donna, CMA;  Colonoscopy within last year  Allergies Marjean Donna, CMA;  No Known Drug Allergies  Medication History (Sonya Bynum, CMA;  Multivitamin Adult (Oral) Active. Medications Reconciled  Social History Davy Pique Bynum, CMA;  Caffeine use Tea. Tobacco use Never smoker.  Family History Marjean Donna, [place];  Family history unknown First Degree Relatives    Review of Systems Davy Pique Bynum CMA General Not Present- Appetite Loss, Chills, Fatigue, Fever, Night Sweats, Weight Gain and Weight Loss. Skin Not Present- Change in Wart/Mole, Dryness, Hives, Jaundice, New Lesions, Non-Healing Wounds, Rash and Ulcer. HEENT Not Present- Earache, Hearing Loss, Hoarseness, Nose Bleed, Oral Ulcers, Ringing in the Ears, Seasonal Allergies, Sinus Pain, Sore Throat, Visual Disturbances, Wears glasses/contact lenses and Yellow Eyes. Respiratory Not Present- Bloody sputum, Chronic Cough, Difficulty Breathing, Snoring and Wheezing. Breast Not Present- Breast Mass, Breast Pain, Nipple Discharge and Skin Changes. Cardiovascular Not Present- Chest Pain, Difficulty Breathing Lying Down, Leg Cramps, Palpitations,  Rapid Heart Rate, Shortness of Breath and Swelling of Extremities. Gastrointestinal Not Present- Abdominal Pain, Bloating, Bloody Stool, Change in Bowel Habits, Chronic diarrhea, Constipation, Difficulty Swallowing, Excessive gas, Gets full quickly at meals, Hemorrhoids, Indigestion, Nausea, Rectal Pain and Vomiting. Male Genitourinary Not Present- Blood in Urine, Change in Urinary Stream, Frequency, Impotence, Nocturia, Painful Urination, Urgency and Urine Leakage. Musculoskeletal Not Present- Back Pain, Joint Pain, Joint Stiffness, Muscle Pain, Muscle Weakness and Swelling of Extremities. Neurological Not Present- Decreased Memory, Fainting, Headaches, Numbness, Seizures, Tingling, Tremor, Trouble walking and Weakness. Psychiatric Not Present- Anxiety, Bipolar, Change in Sleep Pattern, Depression, Fearful and Frequent crying. Endocrine Not Present- Cold Intolerance, Excessive Hunger, Hair Changes, Heat Intolerance, Hot flashes and New Diabetes. Hematology Not Present- Easy Bruising, Excessive bleeding, Gland problems, HIV and Persistent Infections.  Vitals Weight: 217 lb Height: 67in Body Surface Area: 2.09 m Body Mass Index: 33.99 kg/m  Temp.: 51F(Temporal)  Pulse: 76 (Regular)  BP: 132/80 (Sitting, Left Arm, Standard)   Physical Exam  General Mental Status-Alert. General Appearance-Consistent with stated age. Hydration-Well hydrated. Voice-Normal.  Head and Neck Head-normocephalic, atraumatic with no lesions or palpable masses. Trachea-midline.  Eye Eyeball - Bilateral-Extraocular movements intact. Sclera/Conjunctiva - Bilateral-No scleral icterus.  Chest and Lung Exam Chest and lung exam reveals -quiet, even and easy respiratory effort with no use of accessory muscles and on auscultation, normal breath sounds, no adventitious sounds and normal vocal resonance. Inspection Chest Wall - Normal. Back - normal.  Cardiovascular Cardiovascular  examination reveals -normal heart sounds, regular rate and rhythm with no murmurs and normal pedal pulses bilaterally.  Abdomen Inspection Skin - Scar - no surgical scars. Hernias - Diastasis recti - Present. Umbilical hernia - Reducible. Note: There is a small, easily reducible hernia at the umbilicus.  Inguinal hernia - Left - Reducible. Palpation/Percussion Palpation and Percussion of the abdomen reveal - Soft, Non Tender, No Rebound tenderness, No Rigidity (guarding) and No hepatosplenomegaly. Auscultation Auscultation of the abdomen reveals - Bowel sounds normal.  Neurologic - Did not examine.  Musculoskeletal Normal Exam - Left-Upper Extremity Strength Normal and Lower Extremity Strength Normal. Normal Exam - Right-Upper Extremity Strength Normal, Lower Extremity Weakness.    Assessment & Plan  UMBILICAL HERNIA ([id number])  Impression: I discussed the diagnosis with him in detail and gave him literature regarding surgery. Because he is very active and the hernia is becoming more symptomatic, I recommend umbilical hernia repair with mesh. This would be outpatient surgery. I discussed the risk of surgery which includes but is not limited to bleeding, infection, injury to surrounding structures, recurrent hernia, cardiopulmonary issues, etc. He understands and wished to proceed with surgery

## 2016-03-24 ENCOUNTER — Encounter (HOSPITAL_BASED_OUTPATIENT_CLINIC_OR_DEPARTMENT_OTHER): Payer: Self-pay

## 2016-03-24 ENCOUNTER — Ambulatory Visit (HOSPITAL_BASED_OUTPATIENT_CLINIC_OR_DEPARTMENT_OTHER)
Admission: RE | Admit: 2016-03-24 | Discharge: 2016-03-24 | Disposition: A | Discharge status: Home / Self Care | Payer: BLUE CROSS/BLUE SHIELD | Source: Ambulatory Visit | Attending: Surgery | Admitting: Surgery

## 2016-03-24 ENCOUNTER — Ambulatory Visit (HOSPITAL_BASED_OUTPATIENT_CLINIC_OR_DEPARTMENT_OTHER): Payer: BLUE CROSS/BLUE SHIELD | Admitting: Anesthesiology

## 2016-03-24 ENCOUNTER — Encounter (HOSPITAL_BASED_OUTPATIENT_CLINIC_OR_DEPARTMENT_OTHER)
Admission: RE | Disposition: A | Discharge status: Home / Self Care | Payer: Self-pay | Source: Ambulatory Visit | Attending: Surgery

## 2016-03-24 DIAGNOSIS — K429 Umbilical hernia without obstruction or gangrene: Secondary | ICD-10-CM | POA: Diagnosis not present

## 2016-03-24 DIAGNOSIS — Z8546 Personal history of malignant neoplasm of prostate: Secondary | ICD-10-CM | POA: Diagnosis not present

## 2016-03-24 HISTORY — DX: Umbilical hernia without obstruction or gangrene: K42.9

## 2016-03-24 HISTORY — PX: INSERTION OF MESH: SHX5868

## 2016-03-24 HISTORY — PX: UMBILICAL HERNIA REPAIR: SHX196

## 2016-03-24 SURGERY — REPAIR, HERNIA, UMBILICAL, ADULT
Anesthesia: General | Site: Abdomen

## 2016-03-24 MED ORDER — OXYCODONE HCL 5 MG/5ML PO SOLN: 5.0000 mg | Freq: Once | ORAL | Status: DC | PRN

## 2016-03-24 MED ORDER — CEFAZOLIN SODIUM-DEXTROSE 2-4 GM/100ML-% IV SOLN
INTRAVENOUS | Status: AC
  Filled 2016-03-24: qty 100

## 2016-03-24 MED ORDER — FENTANYL CITRATE (PF) 100 MCG/2ML IJ SOLN
INTRAMUSCULAR | Status: AC
  Filled 2016-03-24: qty 2

## 2016-03-24 MED ORDER — LACTATED RINGERS IV SOLN
INTRAVENOUS | Status: DC
  Administered 2016-03-24: 07:00:00 via INTRAVENOUS
  Administered 2016-03-24: 10 mL/h via INTRAVENOUS

## 2016-03-24 MED ORDER — LIDOCAINE 2% (20 MG/ML) 5 ML SYRINGE
INTRAMUSCULAR | Status: AC
  Filled 2016-03-24: qty 5

## 2016-03-24 MED ORDER — HYDROMORPHONE HCL 1 MG/ML IJ SOLN
0.2500 mg | INTRAMUSCULAR | Status: DC | PRN
  Administered 2016-03-24 (×2): 0.5 mg via INTRAVENOUS

## 2016-03-24 MED ORDER — DEXAMETHASONE SODIUM PHOSPHATE 4 MG/ML IJ SOLN
INTRAMUSCULAR | Status: DC | PRN
  Administered 2016-03-24: 10 mg via INTRAVENOUS

## 2016-03-24 MED ORDER — CHLORHEXIDINE GLUCONATE CLOTH 2 % EX PADS: 6.0000 | MEDICATED_PAD | Freq: Once | CUTANEOUS | Status: DC

## 2016-03-24 MED ORDER — OXYCODONE-ACETAMINOPHEN 5-325 MG PO TABS: 1.0000 | ORAL_TABLET | ORAL | 0 refills | Status: AC | PRN

## 2016-03-24 MED ORDER — DEXAMETHASONE SODIUM PHOSPHATE 10 MG/ML IJ SOLN
INTRAMUSCULAR | Status: AC
  Filled 2016-03-24: qty 1

## 2016-03-24 MED ORDER — MEPERIDINE HCL 25 MG/ML IJ SOLN: 6.2500 mg | INTRAMUSCULAR | Status: DC | PRN

## 2016-03-24 MED ORDER — MIDAZOLAM HCL 2 MG/2ML IJ SOLN
INTRAMUSCULAR | Status: AC
  Filled 2016-03-24: qty 2

## 2016-03-24 MED ORDER — KETOROLAC TROMETHAMINE 30 MG/ML IJ SOLN
INTRAMUSCULAR | Status: AC
  Filled 2016-03-24: qty 1

## 2016-03-24 MED ORDER — CEFAZOLIN SODIUM-DEXTROSE 2-4 GM/100ML-% IV SOLN: 2.0000 g | INTRAVENOUS | Status: DC

## 2016-03-24 MED ORDER — LIDOCAINE HCL (CARDIAC) 20 MG/ML IV SOLN
INTRAVENOUS | Status: DC | PRN
  Administered 2016-03-24: 50 mg via INTRAVENOUS

## 2016-03-24 MED ORDER — GLYCOPYRROLATE 0.2 MG/ML IJ SOLN: 0.2000 mg | Freq: Once | INTRAMUSCULAR | Status: DC | PRN

## 2016-03-24 MED ORDER — OXYCODONE HCL 5 MG PO TABS: 5.0000 mg | ORAL_TABLET | Freq: Once | ORAL | Status: DC | PRN

## 2016-03-24 MED ORDER — MIDAZOLAM HCL 5 MG/5ML IJ SOLN
INTRAMUSCULAR | Status: DC | PRN
  Administered 2016-03-24: 2 mg via INTRAVENOUS

## 2016-03-24 MED ORDER — KETOROLAC TROMETHAMINE 30 MG/ML IJ SOLN
INTRAMUSCULAR | Status: DC | PRN
  Administered 2016-03-24: 30 mg via INTRAVENOUS

## 2016-03-24 MED ORDER — PHENYLEPHRINE 40 MCG/ML (10ML) SYRINGE FOR IV PUSH (FOR BLOOD PRESSURE SUPPORT)
PREFILLED_SYRINGE | INTRAVENOUS | Status: AC
  Filled 2016-03-24: qty 10

## 2016-03-24 MED ORDER — HYDROMORPHONE HCL 1 MG/ML IJ SOLN
INTRAMUSCULAR | Status: AC
  Filled 2016-03-24: qty 1

## 2016-03-24 MED ORDER — BUPIVACAINE-EPINEPHRINE 0.5% -1:200000 IJ SOLN
INTRAMUSCULAR | Status: DC | PRN
  Administered 2016-03-24: 20 mL

## 2016-03-24 MED ORDER — PROPOFOL 10 MG/ML IV BOLUS
INTRAVENOUS | Status: DC | PRN
  Administered 2016-03-24: 200 mg via INTRAVENOUS
  Administered 2016-03-24: 100 mg via INTRAVENOUS

## 2016-03-24 MED ORDER — FENTANYL CITRATE (PF) 100 MCG/2ML IJ SOLN
INTRAMUSCULAR | Status: DC | PRN
  Administered 2016-03-24: 100 ug via INTRAVENOUS

## 2016-03-24 MED ORDER — ONDANSETRON HCL 4 MG/2ML IJ SOLN: 4.0000 mg | Freq: Once | INTRAMUSCULAR | Status: DC | PRN

## 2016-03-24 MED ORDER — LIDOCAINE HCL (PF) 1 % IJ SOLN
INTRAMUSCULAR | Status: AC
  Filled 2016-03-24: qty 30

## 2016-03-24 MED ORDER — BUPIVACAINE-EPINEPHRINE (PF) 0.5% -1:200000 IJ SOLN
INTRAMUSCULAR | Status: AC
  Filled 2016-03-24: qty 60

## 2016-03-24 MED ORDER — SODIUM BICARBONATE 4 % IV SOLN
INTRAVENOUS | Status: AC
  Filled 2016-03-24: qty 5

## 2016-03-24 MED ORDER — FENTANYL CITRATE (PF) 100 MCG/2ML IJ SOLN: 50.0000 ug | INTRAMUSCULAR | Status: DC | PRN

## 2016-03-24 MED ORDER — PHENYLEPHRINE HCL 10 MG/ML IJ SOLN
INTRAMUSCULAR | Status: DC | PRN
  Administered 2016-03-24 (×5): 80 ug via INTRAVENOUS

## 2016-03-24 MED ORDER — MIDAZOLAM HCL 2 MG/2ML IJ SOLN: 1.0000 mg | INTRAMUSCULAR | Status: DC | PRN

## 2016-03-24 MED ORDER — ONDANSETRON HCL 4 MG/2ML IJ SOLN
INTRAMUSCULAR | Status: AC
  Filled 2016-03-24: qty 2

## 2016-03-24 MED ORDER — PROPOFOL 500 MG/50ML IV EMUL
INTRAVENOUS | Status: AC
  Filled 2016-03-24: qty 50

## 2016-03-24 MED ORDER — CEFAZOLIN SODIUM-DEXTROSE 2-3 GM-% IV SOLR
INTRAVENOUS | Status: DC | PRN
  Administered 2016-03-24: 2 g via INTRAVENOUS

## 2016-03-24 MED ORDER — SCOPOLAMINE 1 MG/3DAYS TD PT72: 1.0000 | MEDICATED_PATCH | Freq: Once | TRANSDERMAL | Status: DC | PRN

## 2016-03-24 SURGICAL SUPPLY — 40 items
BLADE CLIPPER SURG (BLADE) ×2 IMPLANT
BLADE HEX COATED 2.75 (ELECTRODE) ×2 IMPLANT
BLADE SURG 15 STRL LF DISP TIS (BLADE) ×1 IMPLANT
BLADE SURG 15 STRL SS (BLADE) ×1
CANISTER SUCT 1200ML W/VALVE (MISCELLANEOUS) IMPLANT
CHLORAPREP W/TINT 26ML (MISCELLANEOUS) ×2 IMPLANT
COVER BACK TABLE 60X90IN (DRAPES) ×2 IMPLANT
COVER MAYO STAND STRL (DRAPES) ×2 IMPLANT
DECANTER SPIKE VIAL GLASS SM (MISCELLANEOUS) IMPLANT
DERMABOND ADVANCED (GAUZE/BANDAGES/DRESSINGS) ×1
DERMABOND ADVANCED .7 DNX12 (GAUZE/BANDAGES/DRESSINGS) ×1 IMPLANT
DRAPE LAPAROTOMY 100X72 PEDS (DRAPES) ×2 IMPLANT
DRAPE UTILITY XL STRL (DRAPES) ×2 IMPLANT
DRSG TEGADERM 2-3/8X2-3/4 SM (GAUZE/BANDAGES/DRESSINGS) IMPLANT
ELECT REM PT RETURN 9FT ADLT (ELECTROSURGICAL) ×2
ELECTRODE REM PT RTRN 9FT ADLT (ELECTROSURGICAL) ×1 IMPLANT
GLOVE SURG SIGNA 7.5 PF LTX (GLOVE) ×2 IMPLANT
GOWN STRL REUS W/ TWL LRG LVL3 (GOWN DISPOSABLE) ×1 IMPLANT
GOWN STRL REUS W/ TWL XL LVL3 (GOWN DISPOSABLE) ×1 IMPLANT
GOWN STRL REUS W/TWL LRG LVL3 (GOWN DISPOSABLE) ×1
GOWN STRL REUS W/TWL XL LVL3 (GOWN DISPOSABLE) ×1
MESH VENTRALEX ST 2.5 CRC MED (Mesh General) ×2 IMPLANT
NEEDLE HYPO 25X1 1.5 SAFETY (NEEDLE) ×2 IMPLANT
NS IRRIG 1000ML POUR BTL (IV SOLUTION) ×2 IMPLANT
PACK BASIN DAY SURGERY FS (CUSTOM PROCEDURE TRAY) ×2 IMPLANT
PENCIL BUTTON HOLSTER BLD 10FT (ELECTRODE) ×2 IMPLANT
SLEEVE SCD COMPRESS KNEE MED (MISCELLANEOUS) ×2 IMPLANT
SPONGE LAP 4X18 X RAY DECT (DISPOSABLE) IMPLANT
SUT MNCRL AB 4-0 PS2 18 (SUTURE) ×2 IMPLANT
SUT NOVA 0 T19/GS 22DT (SUTURE) IMPLANT
SUT NOVA NAB DX-16 0-1 5-0 T12 (SUTURE) ×2 IMPLANT
SUT VIC AB 2-0 SH 27 (SUTURE)
SUT VIC AB 2-0 SH 27XBRD (SUTURE) IMPLANT
SUT VIC AB 3-0 SH 27 (SUTURE) ×1
SUT VIC AB 3-0 SH 27X BRD (SUTURE) ×1 IMPLANT
SYR CONTROL 10ML LL (SYRINGE) ×2 IMPLANT
TOWEL OR 17X24 6PK STRL BLUE (TOWEL DISPOSABLE) ×2 IMPLANT
TOWEL OR NON WOVEN STRL DISP B (DISPOSABLE) ×2 IMPLANT
TUBE CONNECTING 20X1/4 (TUBING) IMPLANT
YANKAUER SUCT BULB TIP NO VENT (SUCTIONS) IMPLANT

## 2016-03-24 NOTE — Discharge Instructions (Signed)
[facility] _______[facility]  UMBILICAL OR INGUINAL HERNIA REPAIR: POST OP INSTRUCTIONS  Always review your discharge instruction sheet given to you by the facility where your surgery was performed. IF YOU HAVE DISABILITY OR FAMILY LEAVE FORMS, YOU MUST BRING THEM TO THE OFFICE FOR PROCESSING.   DO NOT GIVE THEM TO YOUR DOCTOR.  1. A  prescription for pain medication may be given to you upon discharge.  Take your pain medication as prescribed, if needed.  If narcotic pain medicine is not needed, then you may take acetaminophen (Tylenol) or ibuprofen (Advil) as needed. 2. Take your usually prescribed medications unless otherwise directed. 3. If you need a refill on your pain medication, please contact your pharmacy.  They will contact our office to request authorization. Prescriptions will not be filled after 5 pm or on week-ends. 4. You should follow a light diet the first 24 hours after arrival home, such as soup and crackers, etc.  Be sure to include lots of fluids daily.  Resume your normal diet the day after surgery. 5. Most patients will experience some swelling and bruising around the umbilicus or in the groin and scrotum.  Ice packs and reclining will help.  Swelling and bruising can take several days to resolve.  6. It is common to experience some constipation if taking pain medication after surgery.  Increasing fluid intake and taking a stool softener (such as Colace) will usually help or prevent this problem from occurring.  A mild laxative (Milk of Magnesia or Miralax) should be taken according to package directions if there are no bowel movements after 48 hours. 7. Unless discharge instructions indicate otherwise, you may remove your bandages 24-48 hours after surgery, and you may shower at that time.  You may have steri-strips (small skin tapes) in place directly over the incision.  These strips should be left on the skin for 7-10 days.  If your surgeon used skin glue on the  incision, you may shower in 24 hours.  The glue will flake off over the next 2-3 weeks.  Any sutures or staples will be removed at the office during your follow-up visit. 8. ACTIVITIES:  You may resume regular (light) daily activities beginning the next day--such as daily self-care, walking, climbing stairs--gradually increasing activities as tolerated.  You may have sexual intercourse when it is comfortable.  Refrain from any heavy lifting or straining until approved by your doctor. a. You may drive when you are no longer taking prescription pain medication, you can comfortably wear a seatbelt, and you can safely maneuver your car and apply brakes. b. RETURN TO WORK:  __________________________________________________________ 9. You should see your doctor in the office for a follow-up appointment approximately 2-3 weeks after your surgery.  Make sure that you call for this appointment within a day or two after you arrive home to insure a convenient appointment time. 10. OTHER INSTRUCTIONS: NO LIFTING MORE THAN 15 TO 20 POUNDS FOR 4 WEEKS 11. OK TO SHOWER TOMORROW 12. ICE PACK AND IBUPROFEN ALSO FOR PAIN __________________________________________________________________________________________________________________________________________________________________________________________  WHEN TO CALL YOUR DOCTOR: 1. Fever over 101.0 2. Inability to urinate 3. Nausea and/or vomiting 4. Extreme swelling or bruising 5. Continued bleeding from incision. 6. Increased pain, redness, or drainage from the incision  The clinic staff is available to answer your questions during regular business hours.  Please dont hesitate to call and ask to speak to one of the nurses for clinical concerns.  If you have a medical emergency, go to  the nearest emergency room or call 911.  A surgeon from [facility] is always on call at the hospital   [address] [place], [place], [place]  [id number]  ?  [address], [place], [place]   [address] [phone] ? [phone] ? FAX [phone] Web site: [email or web address]       Post Anesthesia Home Care Instructions  Activity: Get plenty of rest for the remainder of the day. A responsible adult should stay with you for 24 hours following the procedure.  For the next 24 hours, DO NOT: -Drive a car -Paediatric nurse -Drink alcoholic beverages -Take any medication unless instructed by your physician -Make any legal decisions or sign important papers.  Meals: Start with liquid foods such as gelatin or soup. Progress to regular foods as tolerated. Avoid greasy, spicy, heavy foods. If nausea and/or vomiting occur, drink only clear liquids until the nausea and/or vomiting subsides. Call your physician if vomiting continues.  Special Instructions/Symptoms: Your throat may feel dry or sore from the anesthesia or the breathing tube placed in your throat during surgery. If this causes discomfort, gargle with warm salt water. The discomfort should disappear within 24 hours.  If you had a scopolamine patch placed behind your ear for the management of post- operative nausea and/or vomiting:  1. The medication in the patch is effective for 72 hours, after which it should be removed.  Wrap patch in a tissue and discard in the trash. Wash hands thoroughly with soap and water. 2. You may remove the patch earlier than 72 hours if you experience unpleasant side effects which may include dry mouth, dizziness or visual disturbances. 3. Avoid touching the patch. Wash your hands with soap and water after contact with the patch.

## 2016-03-24 NOTE — Anesthesia Postprocedure Evaluation (Signed)
Anesthesia Post Note  Patient: Albert Stone  Procedure(s) Performed: Procedure(s) (LRB): UMBLILICAL HERNIA REPAIR (N/A) INSERTION OF MESH (N/A)  Patient location during evaluation: PACU Anesthesia Type: General Level of consciousness: awake and alert Pain management: pain level controlled Vital Signs Assessment: post-procedure vital signs reviewed and stable Respiratory status: spontaneous breathing, nonlabored ventilation and respiratory function stable Cardiovascular status: blood pressure returned to baseline and stable Postop Assessment: no signs of nausea or vomiting Anesthetic complications: no    Last Vitals:  Vitals:   03/24/16 0900 03/24/16 0915  BP: (!) 131/97 127/86  Pulse: 73 66  Resp: 12 (!) 21  Temp:      Last Pain:  Vitals:   03/24/16 0915  TempSrc:   PainSc: 4                  Nalin Mazzocco A

## 2016-03-24 NOTE — Op Note (Signed)
UMBLILICAL HERNIA REPAIR, INSERTION OF MESH  Procedure Note  Reo Mayhue 03/24/2016   Pre-op Diagnosis: umbilical hernia      Post-op Diagnosis: same  Procedure(s): UMBLILICAL HERNIA REPAIR INSERTION OF MESH (6.4 cm round ventral patch)  Surgeon(s): Coralie Keens, MD  Anesthesia: General  Staff:  Circulator: Quitman Livings, RN Scrub Person: Tresa Res, RN  Estimated Blood Loss: Minimal                         Demontez Novack A   Date: 03/24/2016  Time: 8:04 AM

## 2016-03-24 NOTE — Interval H&P Note (Signed)
History and Physical Interval Note:no change in H and P  03/24/2016 7:04 AM  [name]  has presented today for surgery, with the diagnosis of umbilical hernia   The various methods of treatment have been discussed with the patient and family. After consideration of risks, benefits and other options for treatment, the patient has consented to  Procedure(s): [place] (N/Stone) INSERTION OF MESH (N/Stone) as Stone surgical intervention .  The patient's history has been reviewed, patient examined, no change in status, stable for surgery.  I have reviewed the patient's chart and labs.  Questions were answered to the patient's satisfaction.     Albert Stone

## 2016-03-24 NOTE — Transfer of Care (Signed)
Immediate Anesthesia Transfer of Care Note  Patient: Albert Stone  Procedure(s) Performed: Procedure(s): UMBLILICAL HERNIA REPAIR (N/A) INSERTION OF MESH (N/A)  Patient Location: PACU  Anesthesia Type:General  Level of Consciousness: sedated  Airway & Oxygen Therapy: Patient Spontanous Breathing and Patient connected to face mask oxygen  Post-op Assessment: Report given to RN and Post -op Vital signs reviewed and stable  Post vital signs: Reviewed and stable  Last Vitals:  Vitals:   03/24/16 0647  BP: 127/83  Pulse: 73  Resp: 20  Temp: 36.9 C    Last Pain:  Vitals:   03/24/16 0647  TempSrc: Oral         Complications: No apparent anesthesia complications

## 2016-03-24 NOTE — Anesthesia Preprocedure Evaluation (Addendum)

## 2016-03-24 NOTE — Anesthesia Procedure Notes (Signed)
Procedure Name: Intubation Date/Time: 03/24/2016 7:30 AM Performed by: Marrianne Mood Pre-anesthesia Checklist: Patient identified, Emergency Drugs available, Suction available, Patient being monitored and Timeout performed Patient Re-evaluated:Patient Re-evaluated prior to inductionOxygen Delivery Method: Circle system utilized Preoxygenation: Pre-oxygenation with 100% oxygen Intubation Type: IV induction Ventilation: Mask ventilation without difficulty Laryngoscope Size: Glidescope, Mac and 4 Tube type: Oral Tube size: 8.0 mm Number of attempts: 1 Airway Equipment and Method: Stylet and Oral airway Placement Confirmation: ETT inserted through vocal cords under direct vision,  positive ETCO2 and breath sounds checked- equal and bilateral Secured at: 24 cm Tube secured with: Tape Dental Injury: Teeth and Oropharynx as per pre-operative assessment  Difficulty Due To: Difficulty was unanticipated, Difficult Airway- due to limited oral opening, Difficult Airway- due to anterior larynx and Difficult Airway- due to large tongue

## 2016-03-25 ENCOUNTER — Encounter (HOSPITAL_BASED_OUTPATIENT_CLINIC_OR_DEPARTMENT_OTHER): Payer: Self-pay | Admitting: Surgery

## 2016-03-25 NOTE — Op Note (Signed)
Albert Stone, Albert Stone            ACCOUNT NO.:  [id number]  MEDICAL RECORD NO.:  [id number]  LOCATION:                                 FACILITY:  PHYSICIAN:  Coralie Keens, M.D. DATE OF BIRTH:  20-Apr-1946  DATE OF PROCEDURE:  03/24/2016 DATE OF DISCHARGE:                              OPERATIVE REPORT   PREOPERATIVE DIAGNOSIS:  Umbilical hernia.  POSTOPERATIVE DIAGNOSIS:  Umbilical hernia.  PROCEDURE:  Umbilical hernia repair with mesh.  ANESTHESIA:  General 0.5% Marcaine.  ESTIMATED BLOOD LOSS:  Minimal.  PROCEDURE IN DETAIL:  The patient was brought to the operating room, identified as [name].  He was placed supine on the operating room table, and general anesthesia was induced.  His abdomen was then prepped and draped in the usual sterile fashion.  I anesthetized skin, the lower edge of the umbilicus with Marcaine.  I then made a small transverse incision at the lower edge of the umbilicus with a scalpel. I took this down to the hernia sac, which I separated from the overlying umbilical skin.  I then excised the sac in its entirety.  I brought a 6.4 cm round ventral patch onto the field.  I placed it through the fascial opening and pulled it against the peritoneum with stay ties. The mesh was then sewn in place circumferentially with #1 Novafil sutures.  Wide coverage of the fascial defect appeared to be achieved. I then cut the stay ties and closed the fascia over top of the mesh with a figure-of-eight #1 Novafil suture.  I then anesthetized the fascia and skin further with Marcaine.  I tacked the umbilical skin back in place with a 3-0 Vicryl suture.  I then closed subcutaneous tissue with 3-0 Vicryl sutures and closed the skin with a running 4-0 Monocryl suture. Skin glue was then applied.  The patient tolerated the procedure well.  All the counts were correct at the end of the procedure.  The patient was then extubated in the operating room and taken in a  stable condition to the recovery room.     Coralie Keens, M.D.   ______________________________ Coralie Keens, M.D.    DB/MEDQ  D:  03/24/2016  T:  03/25/2016  Job:  [id number]

## 2016-06-10 DIAGNOSIS — Z23 Encounter for immunization: Secondary | ICD-10-CM | POA: Diagnosis not present

## 2016-06-10 DIAGNOSIS — Z0001 Encounter for general adult medical examination with abnormal findings: Secondary | ICD-10-CM | POA: Diagnosis not present

## 2016-06-24 DIAGNOSIS — H40023 Open angle with borderline findings, high risk, bilateral: Secondary | ICD-10-CM | POA: Diagnosis not present

## 2016-11-30 DIAGNOSIS — M79675 Pain in left toe(s): Secondary | ICD-10-CM | POA: Diagnosis not present

## 2016-11-30 DIAGNOSIS — R030 Elevated blood-pressure reading, without diagnosis of hypertension: Secondary | ICD-10-CM | POA: Diagnosis not present

## 2017-02-17 DIAGNOSIS — C61 Malignant neoplasm of prostate: Secondary | ICD-10-CM | POA: Diagnosis not present

## 2017-04-19 DIAGNOSIS — Z23 Encounter for immunization: Secondary | ICD-10-CM | POA: Diagnosis not present

## 2017-06-12 DIAGNOSIS — Z23 Encounter for immunization: Secondary | ICD-10-CM | POA: Diagnosis not present

## 2017-06-12 DIAGNOSIS — Z0000 Encounter for general adult medical examination without abnormal findings: Secondary | ICD-10-CM | POA: Diagnosis not present

## 2017-06-12 DIAGNOSIS — Z13220 Encounter for screening for lipoid disorders: Secondary | ICD-10-CM | POA: Diagnosis not present

## 2018-03-30 DIAGNOSIS — Z8546 Personal history of malignant neoplasm of prostate: Secondary | ICD-10-CM | POA: Diagnosis not present

## 2018-05-07 DIAGNOSIS — Z23 Encounter for immunization: Secondary | ICD-10-CM | POA: Diagnosis not present

## 2018-05-08 DIAGNOSIS — M25562 Pain in left knee: Secondary | ICD-10-CM | POA: Diagnosis not present

## 2018-05-08 DIAGNOSIS — R030 Elevated blood-pressure reading, without diagnosis of hypertension: Secondary | ICD-10-CM | POA: Diagnosis not present

## 2018-05-08 DIAGNOSIS — Z6835 Body mass index (BMI) 35.0-35.9, adult: Secondary | ICD-10-CM | POA: Diagnosis not present

## 2018-05-14 DIAGNOSIS — H524 Presbyopia: Secondary | ICD-10-CM | POA: Diagnosis not present

## 2018-05-14 DIAGNOSIS — H40023 Open angle with borderline findings, high risk, bilateral: Secondary | ICD-10-CM | POA: Diagnosis not present

## 2018-06-11 DIAGNOSIS — Z86010 Personal history of colonic polyps: Secondary | ICD-10-CM | POA: Diagnosis not present

## 2018-06-11 DIAGNOSIS — K640 First degree hemorrhoids: Secondary | ICD-10-CM | POA: Diagnosis not present

## 2018-06-11 DIAGNOSIS — K5730 Diverticulosis of large intestine without perforation or abscess without bleeding: Secondary | ICD-10-CM | POA: Diagnosis not present

## 2018-06-11 DIAGNOSIS — D125 Benign neoplasm of sigmoid colon: Secondary | ICD-10-CM | POA: Diagnosis not present

## 2018-06-13 DIAGNOSIS — D125 Benign neoplasm of sigmoid colon: Secondary | ICD-10-CM | POA: Diagnosis not present

## 2018-07-23 DIAGNOSIS — Z0000 Encounter for general adult medical examination without abnormal findings: Secondary | ICD-10-CM | POA: Diagnosis not present

## 2018-07-27 DIAGNOSIS — Z23 Encounter for immunization: Secondary | ICD-10-CM | POA: Diagnosis not present

## 2018-07-27 DIAGNOSIS — Z0000 Encounter for general adult medical examination without abnormal findings: Secondary | ICD-10-CM | POA: Diagnosis not present

## 2019-02-22 DIAGNOSIS — R7301 Impaired fasting glucose: Secondary | ICD-10-CM | POA: Diagnosis not present

## 2019-02-22 DIAGNOSIS — M179 Osteoarthritis of knee, unspecified: Secondary | ICD-10-CM | POA: Diagnosis not present

## 2019-03-08 DIAGNOSIS — Z23 Encounter for immunization: Secondary | ICD-10-CM | POA: Diagnosis not present

## 2019-03-08 DIAGNOSIS — R7301 Impaired fasting glucose: Secondary | ICD-10-CM | POA: Diagnosis not present

## 2019-03-22 DIAGNOSIS — Z1159 Encounter for screening for other viral diseases: Secondary | ICD-10-CM | POA: Diagnosis not present

## 2019-03-22 DIAGNOSIS — E119 Type 2 diabetes mellitus without complications: Secondary | ICD-10-CM | POA: Diagnosis not present

## 2019-03-25 DIAGNOSIS — Z0000 Encounter for general adult medical examination without abnormal findings: Secondary | ICD-10-CM | POA: Diagnosis not present

## 2019-03-25 DIAGNOSIS — E119 Type 2 diabetes mellitus without complications: Secondary | ICD-10-CM | POA: Diagnosis not present

## 2019-03-29 DIAGNOSIS — Z1159 Encounter for screening for other viral diseases: Secondary | ICD-10-CM | POA: Diagnosis not present

## 2019-04-12 DIAGNOSIS — Z8546 Personal history of malignant neoplasm of prostate: Secondary | ICD-10-CM | POA: Diagnosis not present

## 2019-04-12 DIAGNOSIS — C61 Malignant neoplasm of prostate: Secondary | ICD-10-CM | POA: Diagnosis not present

## 2019-05-27 DIAGNOSIS — H40023 Open angle with borderline findings, high risk, bilateral: Secondary | ICD-10-CM | POA: Diagnosis not present

## 2019-07-09 ENCOUNTER — Other Ambulatory Visit: Payer: BLUE CROSS/BLUE SHIELD
